# Patient Record
Sex: Female | Born: 1990 | Race: White | Hispanic: No | Marital: Single
Health system: Southern US, Community
[De-identification: ages and names within clinical notes are randomized; demographics above are authoritative.]

---

## 2012-02-29 ENCOUNTER — Emergency Department: Payer: Self-pay | Admitting: *Deleted

## 2012-03-01 LAB — URINALYSIS, COMPLETE
Bacteria: NEGATIVE
Bacteria: NEGATIVE
Bilirubin,UR: NEGATIVE
Bilirubin,UR: NEGATIVE
Glucose,UR: NEGATIVE mg/dL (ref 0–75)
Ketone: NEGATIVE
Ketone: NEGATIVE
Leukocyte Esterase: NEGATIVE
Leukocyte Esterase: NEGATIVE
Nitrite: NEGATIVE
Ph: 5 (ref 4.5–8.0)
Specific Gravity: 1.018 (ref 1.003–1.030)
Specific Gravity: 1.024 (ref 1.003–1.030)

## 2012-03-01 LAB — BASIC METABOLIC PANEL
Calcium, Total: 8.9 mg/dL — ABNORMAL LOW (ref 9.0–10.7)
Chloride: 106 mmol/L (ref 97–107)
Creatinine: 0.72 mg/dL (ref 0.60–1.30)
EGFR (African American): >60
EGFR (Non-African Amer.): >60
Glucose: 91 mg/dL (ref 65–99)
Sodium: 142 mmol/L — ABNORMAL HIGH (ref 132–141)

## 2012-03-01 LAB — CBC
HGB: 13.8 g/dL (ref 12.0–16.0)
MCHC: 35.1 g/dL (ref 32.0–36.0)
MCV: 87 fL (ref 80–100)
Platelet: 301 10*3/uL (ref 150–440)
RBC: 4.54 10*6/uL (ref 3.80–5.20)

## 2014-09-13 ENCOUNTER — Inpatient Hospital Stay: Payer: Self-pay | Admitting: Internal Medicine

## 2014-09-13 LAB — CBC WITH DIFFERENTIAL/PLATELET
Basophil #: 0 10*3/uL (ref 0.0–0.1)
Basophil %: 0.1 %
Eosinophil #: 0 10*3/uL (ref 0.0–0.7)
Eosinophil %: 0.1 %
HCT: 42 % (ref 35.0–47.0)
HGB: 14.2 g/dL (ref 12.0–16.0)
LYMPHS PCT: 2.5 %
Lymphocyte #: 0.4 10*3/uL — ABNORMAL LOW (ref 1.0–3.6)
MCH: 29.9 pg (ref 26.0–34.0)
MCHC: 33.7 g/dL (ref 32.0–36.0)
MCV: 89 fL (ref 80–100)
MONOS PCT: 3.3 %
Monocyte #: 0.5 x10 3/mm (ref 0.2–0.9)
Neutrophil #: 15.6 10*3/uL — ABNORMAL HIGH (ref 1.4–6.5)
Neutrophil %: 94 %
PLATELETS: 250 10*3/uL (ref 150–440)
RBC: 4.74 10*6/uL (ref 3.80–5.20)
RDW: 12.3 % (ref 11.5–14.5)
WBC: 16.6 10*3/uL — ABNORMAL HIGH (ref 3.6–11.0)

## 2014-09-13 LAB — COMPREHENSIVE METABOLIC PANEL
ALBUMIN: 3.7 g/dL (ref 3.4–5.0)
ALT: 23 U/L
AST: 27 U/L (ref 15–37)
Alkaline Phosphatase: 44 U/L — ABNORMAL LOW
Anion Gap: 10 (ref 7–16)
BUN: 12 mg/dL (ref 7–18)
Bilirubin,Total: 0.8 mg/dL (ref 0.2–1.0)
Calcium, Total: 8 mg/dL — ABNORMAL LOW (ref 8.5–10.1)
Chloride: 109 mmol/L — ABNORMAL HIGH (ref 98–107)
Co2: 24 mmol/L (ref 21–32)
Creatinine: 0.84 mg/dL (ref 0.60–1.30)
EGFR (African American): >60
EGFR (Non-African Amer.): >60
GLUCOSE: 140 mg/dL — AB (ref 65–99)
Osmolality: 287 (ref 275–301)
Potassium: 3.8 mmol/L (ref 3.5–5.1)
SODIUM: 143 mmol/L (ref 136–145)
TOTAL PROTEIN: 6.6 g/dL (ref 6.4–8.2)

## 2014-09-13 LAB — URINALYSIS, COMPLETE
Bacteria: NONE SEEN
Bilirubin,UR: NEGATIVE
Blood: NEGATIVE
Glucose,UR: NEGATIVE mg/dL (ref 0–75)
LEUKOCYTE ESTERASE: NEGATIVE
NITRITE: NEGATIVE
Ph: 7 (ref 4.5–8.0)
Protein: NEGATIVE
RBC,UR: NONE SEEN /HPF (ref 0–5)
Specific Gravity: 1.009 (ref 1.003–1.030)
Squamous Epithelial: NONE SEEN
WBC UR: NONE SEEN /HPF (ref 0–5)

## 2014-09-13 LAB — MAGNESIUM
MAGNESIUM: 2.6 mg/dL — AB
Magnesium: 1.2 mg/dL — ABNORMAL LOW

## 2014-09-13 LAB — PREGNANCY, URINE: Pregnancy Test, Urine: NEGATIVE m[IU]/mL

## 2014-09-13 LAB — LIPASE, BLOOD: Lipase: 94 U/L (ref 73–393)

## 2014-09-14 LAB — BASIC METABOLIC PANEL
Anion Gap: 4 — ABNORMAL LOW (ref 7–16)
BUN: 7 mg/dL (ref 7–18)
CALCIUM: 7.4 mg/dL — AB (ref 8.5–10.1)
CREATININE: 0.76 mg/dL (ref 0.60–1.30)
Chloride: 107 mmol/L (ref 98–107)
Co2: 27 mmol/L (ref 21–32)
EGFR (Non-African Amer.): >60
Glucose: 115 mg/dL — ABNORMAL HIGH (ref 65–99)
Osmolality: 275 (ref 275–301)
POTASSIUM: 3.2 mmol/L — AB (ref 3.5–5.1)
Sodium: 138 mmol/L (ref 136–145)

## 2014-09-14 LAB — CBC WITH DIFFERENTIAL/PLATELET
BASOS ABS: 0 10*3/uL (ref 0.0–0.1)
Basophil %: 0 %
Eosinophil #: 0 10*3/uL (ref 0.0–0.7)
Eosinophil %: 0.1 %
HCT: 36 % (ref 35.0–47.0)
HGB: 12.5 g/dL (ref 12.0–16.0)
LYMPHS ABS: 0.7 10*3/uL — AB (ref 1.0–3.6)
Lymphocyte %: 12 %
MCH: 30.8 pg (ref 26.0–34.0)
MCHC: 34.8 g/dL (ref 32.0–36.0)
MCV: 89 fL (ref 80–100)
Monocyte #: 0.6 x10 3/mm (ref 0.2–0.9)
Monocyte %: 10.2 %
NEUTROS ABS: 4.8 10*3/uL (ref 1.4–6.5)
NEUTROS PCT: 77.7 %
Platelet: 182 10*3/uL (ref 150–440)
RBC: 4.07 10*6/uL (ref 3.80–5.20)
RDW: 11.8 % (ref 11.5–14.5)
WBC: 6.1 10*3/uL (ref 3.6–11.0)

## 2014-09-14 LAB — MAGNESIUM: Magnesium: 2.1 mg/dL

## 2015-02-17 NOTE — Discharge Summary (Signed)
PATIENT NAME:  Natalie Beltran, Natalie Beltran MR#:  [id number] DATE OF BIRTH:  [birth year]  DATE OF ADMISSION:  09/13/2014 DATE OF DISCHARGE:  09/14/2014  PRIMARY CARE PHYSICIAN: Nonlocal.   FINAL DIAGNOSES:  1.  Likely viral gastroenteritis with nausea, vomiting, and diarrhea with leukocytosis.  2.  Hypomagnesemia and hypokalemia.   MEDICATIONS ON DISCHARGE: Include Effexor 150 mg extended-release daily.   FOLLOWUP: [facility].   DIET: Regular, stay hydrated, regular consistency.   HOSPITAL COURSE: The patient was admitted to the hospital 09/13/2014, discharged on 09/14/2014, came in with abdominal pain, nausea, vomiting, diarrhea. Laboratory and radiological data during the hospital course included a magnesium of 1.2, lipase 94, glucose 140, BUN 12, creatinine 0.84, sodium 143, potassium 3.8, chloride 109, CO2 of 24, calcium 8.0. Liver function tests: Alkaline phosphatase 44, ALT 23, AST 27. White blood cell count 16.6, hemoglobin and hematocrit 14.2 and 42.0, platelet count of 250,000. Urine pregnancy test negative. Urinalysis negative. CT scan of the abdomen and pelvis showed findings in the distal small bowel and proximal colon with mild bowel wall thickening changes likely represent enteritis of nonspecific etiology. No evidence of obstruction. Magnesium replaced up to 2.1. White count upon discharge 6.1, hemoglobin 12.5, creatinine 0.76, potassium 3.2. Hospital course per problem list:  1.  Likely viral gastroenteritis with nausea, vomiting, diarrhea, and leukocytosis. All symptoms had resolved. A few other people at school had similar symptoms, so this is likely viral. The patient was stable for discharge home without any further symptoms on 09/14/2014.  2.  For the patient's hypomagnesemia and hypokalemia both these were replaced during the hospital course.  3.  For the patient's depression the patient is on Effexor.   PHYSICAL EXAMINATION UPON DISCHARGE:   VITAL SIGNS: Temperature 98.3, pulse 83, respirations 18, blood pressure 103/69 pulse oximetry 95% on room air.  LUNGS: Clear to auscultation. No use of accessory muscles to breathe.  CARDIOVASCULAR SYSTEM: S1, S2 normal. No gallops, rubs, or murmurs heard. Carotid upstroke 2+ bilaterally. No bruits. Dorsalis pedis pulses 2+ bilaterally.  ABDOMEN: Soft, nontender. No organomegaly/splenomegaly. Normoactive bowel sounds. No masses felt.  LYMPHATIC: No lymph nodes in the neck.  MUSCULOSKELETAL: No clubbing, edema, or cyanosis.   TIME SPENT ON DISCHARGE: 35 minutes.    ____________________________ Herschell Dimesichard J. Renae GlossWieting, MD rjw:bm D: 09/14/2014 14:08:41 ET T: 09/15/2014 01:39:55 ET JOB#: [id number]  cc: Herschell Dimesichard J. Renae GlossWieting, MD, <Dictator> Salley ScarletICHARD J Dacian Orrico MD ELECTRONICALLY SIGNED 09/15/2014 16:32

## 2015-02-17 NOTE — H&P (Signed)
PATIENT NAME:  Natalie Beltran, Natalie Beltran MR#:  [id number] DATE OF BIRTH:  19-May-1991  DATE OF ADMISSION:  09/13/2014  PRIMARY CARE PHYSICIAN: Nonlocal.   REFERRING PHYSICIAN: Gladstone Pih, MD    CHIEF COMPLAINT: Abdominal pain, nausea, vomiting and diarrhea since last night.   HISTORY OF PRESENT ILLNESS: A 24 years old Caucasian female with a history of anxiety presented to the ED with above chief complaint. The patient is alert, awake, oriented, in no acute distress. The patient is an [employer].  She started to have nausea, vomiting, diarrhea since 8:00 p.m. yesterday. She noticed some blood last night but no more blood in the stool today. Diarrhea is watery. The patient got a CAT scan, which did not show any obstruction. The patient was treated with Zofran and IV fluid support but the patient still had persistent intractable nausea, vomiting. In addition, the patient has headache, dizziness and generalized weakness.   PAST MEDICAL HISTORY: Anxiety.   SOCIAL HISTORY: No smoking, alcohol drinking or illicit drugs.  SURGICAL HISTORY:  None.    FAMILY HISTORY: Grandmother has diabetes.   REVIEW OF SYSTEMS:   CONSTITUTIONAL: The patient denies any fever or chills but has headache, dizziness and weakness.  EYES: No double vision or blurry vision.  EARS, NOSE, AND THROAT: No postnasal drip or slurred speech or dysphagia.  CARDIOVASCULAR: No chest pain, palpitation, orthopnea, nocturnal dyspnea. No leg edema.  PULMONARY: No cough, sputum, shortness of breath, or hemoptysis.   GASTROINTESTINAL: Positive for abdominal pain, nausea, vomiting, diarrhea. No melena, but bloody stool.  GENITOURINARY: No dysuria, hematuria, or incontinence.  SKIN: No rash or jaundice.  NEUROLOGY: No syncope, loss of consciousness, or seizure.  ENDOCRINOLOGY: No polyuria, polydipsia, heat or cold intolerance.  HEMATOLOGY: No easy bruising or bleeding.   ALLERGIES: None.   HOME MEDICATIONS: Effexor 150 mg  one cap p.o. daily.   PHYSICAL EXAMINATION:  VITAL SIGNS: Temperature 98.4, blood pressure 111/70, pulse 65, O2 saturation 97% on room air.  GENERAL: The patient is alert, awake, oriented, in no acute distress.  HEENT: Pupils round, equal, react to light and accommodation. Moist oral mucosa. Clear oropharynx.  NECK: Supple. No JVD or carotid bruits. No lymphadenopathy. No thyromegaly,  CARDIOVASCULAR: S1 and S2, regular rate and rhythm. No murmurs, gallops.  PULMONARY: Bilateral air entry. No wheezing or rales. No use of accessory muscle to breathe.  ABDOMEN: Diffuse tenderness with mild rigidity but no rebound. Bowel sounds present. No organomegaly.  EXTREMITIES: No edema, clubbing or cyanosis. No calf tenderness. Bilateral pedal pulses present.  SKIN: No rash, jaundice.  NEUROLOGIC: A/O x 3. No focal deficit. Power 5/5. Sensory intact.   LABORATORY DATA: CAT scan of abdomen and pelvis show possible enteritis. Urinalysis negative. Pregnant test negative. WBC 16.6, hemoglobin 14.2, platelets 250, glucose 140, BUN 12, creatinine 0.84. Electrolytes normal. Lipase 94.   IMPRESSIONS:  1.  Acute gastroenteritis.  2.  Anxiety.   3.  Leukocytosis possibly due to reaction.   PLAN OF TREATMENT:  1.  The patient will be placed on observation and we will give IV fluid support, Zofran p.r.n. and pain medication p.r.n. In addition, we will give Cipro IV b.i.d. Follow up BMP and magneseum. 2.  We will check stool culture and occult.  3.  The patient said that other 2 students had similar symptoms last night but the patient denies any ill contact with them.    Discussed the patient's condition and plan of treatment with the patient.   TIME SPENT: About 45  minutes.    ____________________________ Shaune PollackQing Romaine Neville, MD qc:AT D: 09/13/2014 03:38:13 ET T: 09/13/2014 04:54:17 ET JOB#: [id number]  cc: Shaune PollackQing Latora Quarry, MD, <Dictator> Shaune PollackQING Alisea Matte MD ELECTRONICALLY SIGNED 09/13/2014 7:02

## 2015-10-19 IMAGING — CT CT ABD-PELV W/ CM
2 of 4 series · 17 of 46 positions shown, 19 images · IV contrast (agent unspecified)
Comparison: 03/01/2012

CLINICAL DATA: Acute onset nausea and vomiting since 0181 hours.

EXAM:
CT ABDOMEN AND PELVIS WITH CONTRAST
TECHNIQUE: Multidetector CT imaging of the abdomen and pelvis was performed
using the standard protocol following bolus administration of
intravenous contrast.
CONTRAST:  100 mL [id number]

[Series 2: routine abd pel with · axial · 0.64mm/px · z∈[-1018,-614]mm · 14 of 89 slices shown, 16 images]
[im 4/89  soft-tissue]
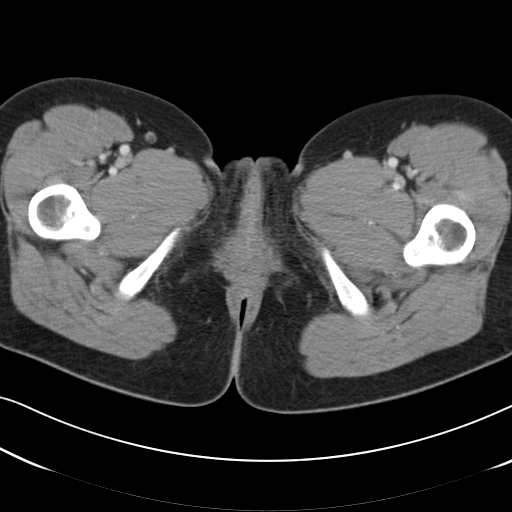
[im 4/89  bone]
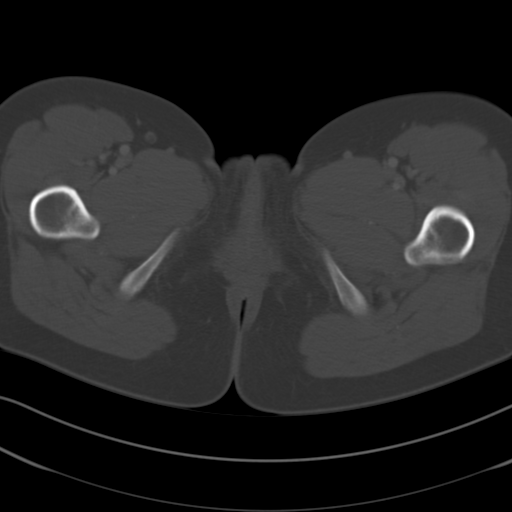
[im 12/89  soft-tissue]
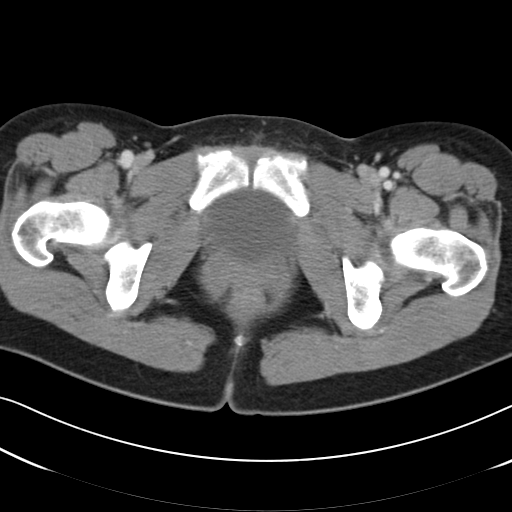
[im 19/89  soft-tissue]
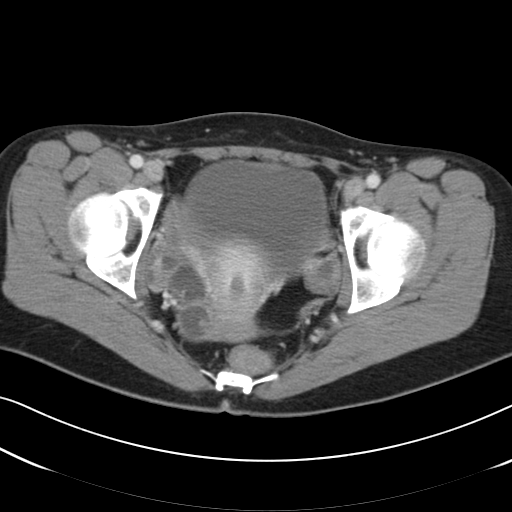
[im 23/89  soft-tissue]
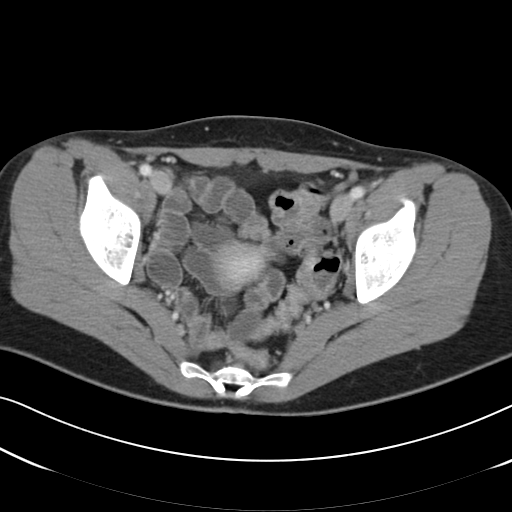
[im 30/89  soft-tissue]
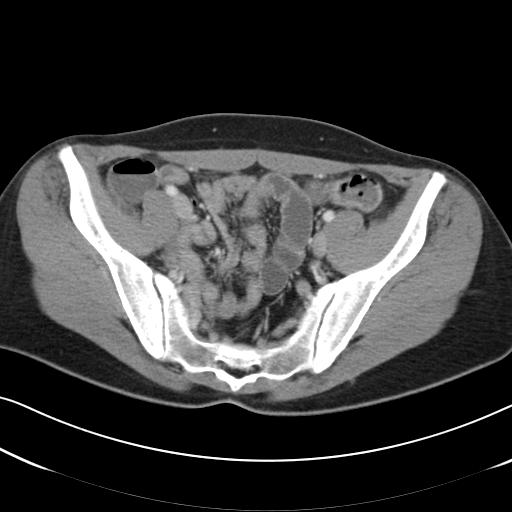
[im 37/89  soft-tissue]
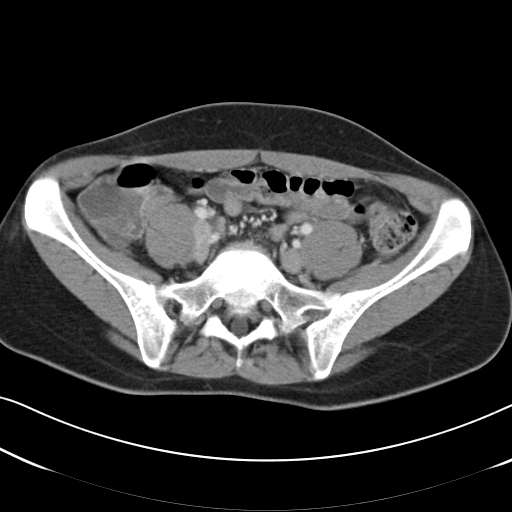
[im 41/89  soft-tissue]
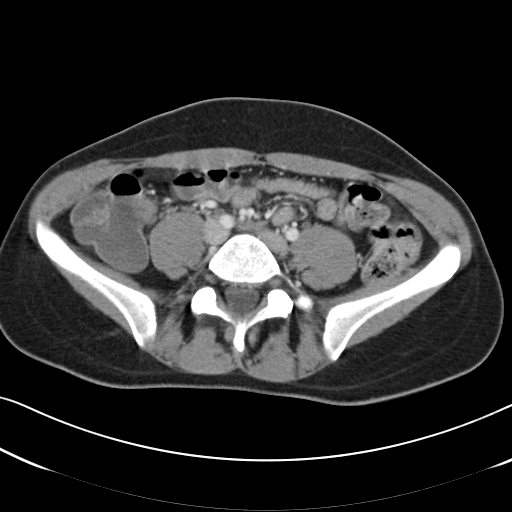
[im 48/89  soft-tissue]
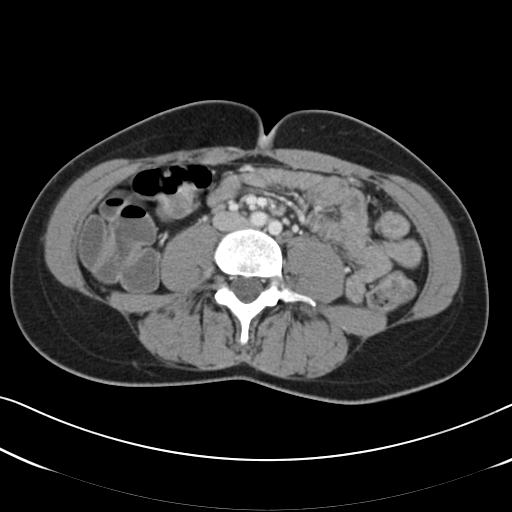
[im 52/89  soft-tissue]
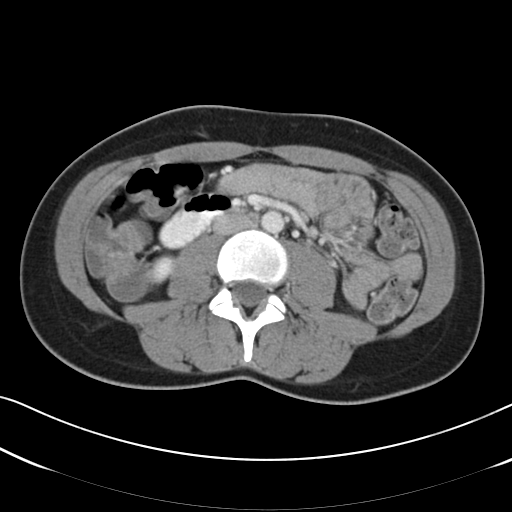
[im 52/89  bone]
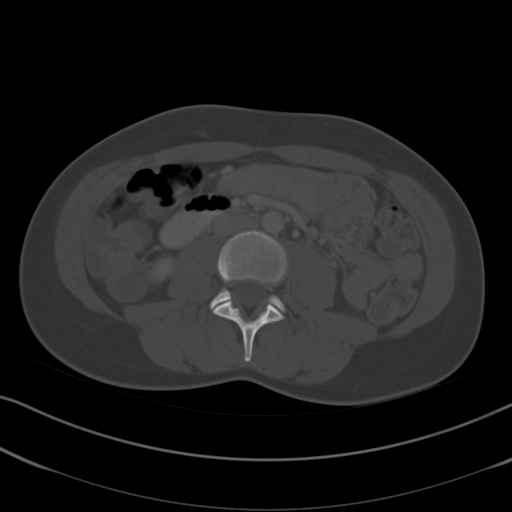
[im 59/89  soft-tissue]
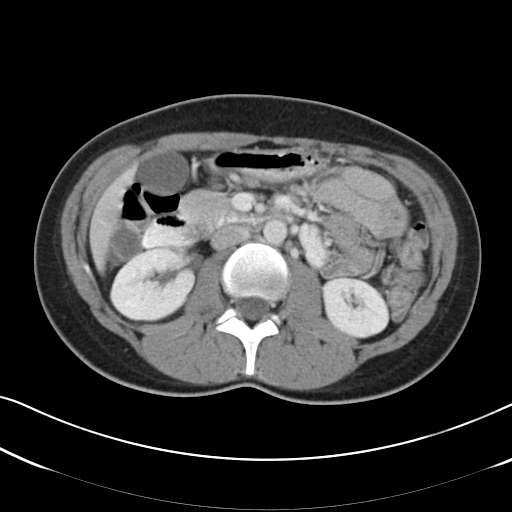
[im 67/89  soft-tissue]
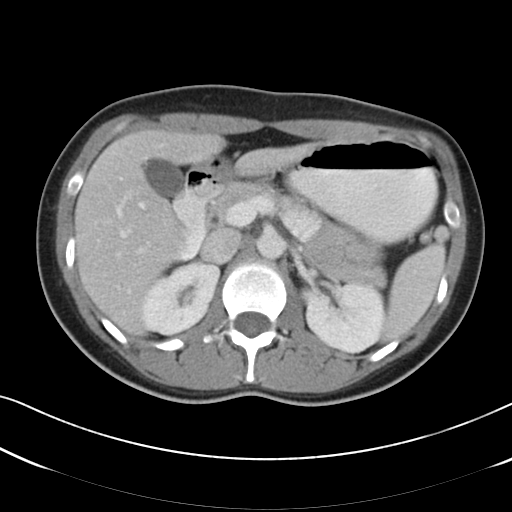
[im 70/89  soft-tissue]
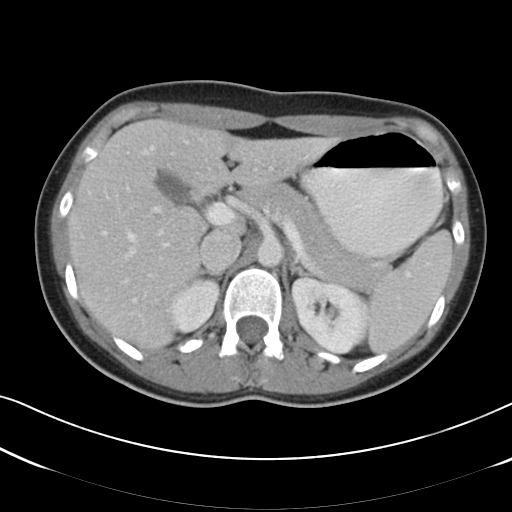
[im 78/89  soft-tissue]
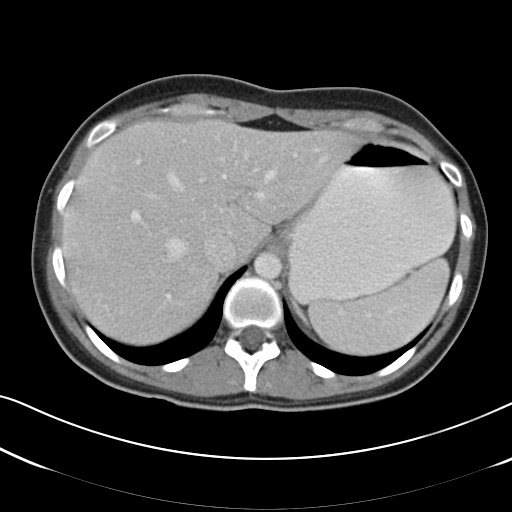
[im 85/89  soft-tissue]
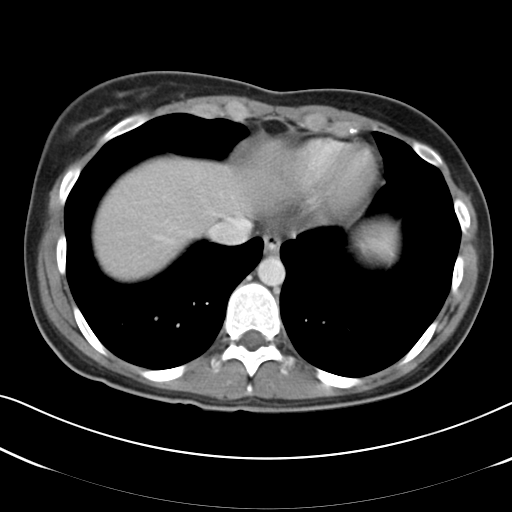

[Series 5: cor routine abd pel with · coronal · 0.89mm/px · 3 of 105 slices shown]
[im 35/105  soft-tissue]
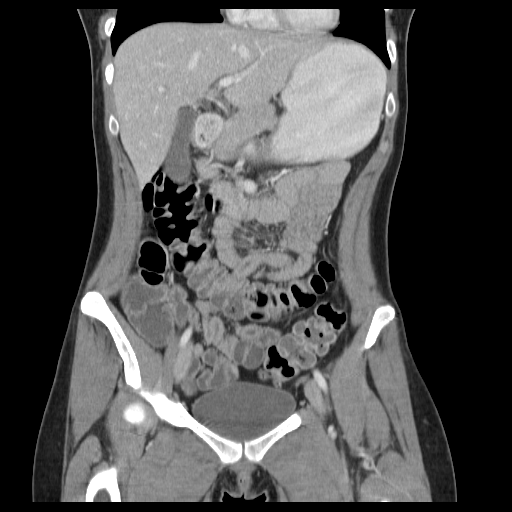
[im 47/105  soft-tissue]
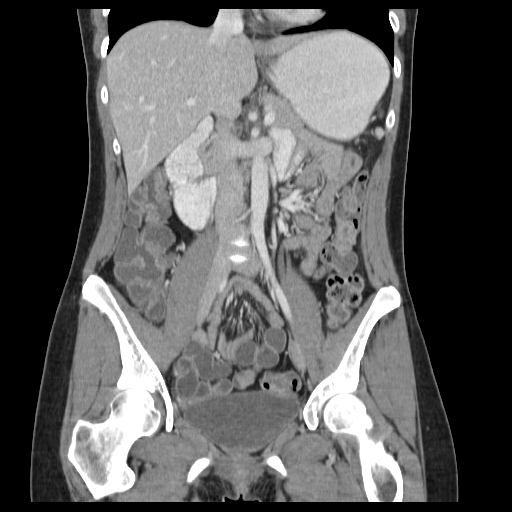
[im 58/105  soft-tissue]
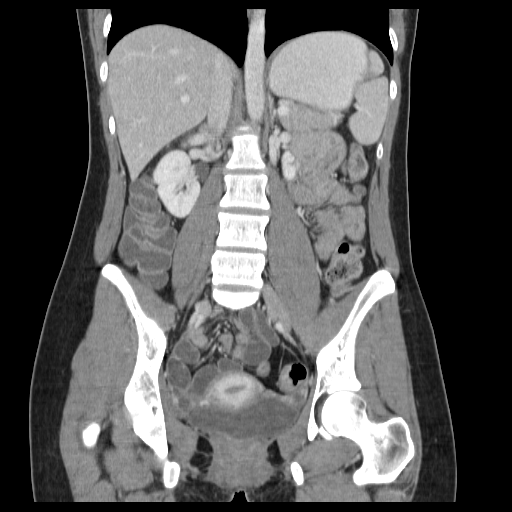

[17 of 46 positions shown; findings below may reference images not displayed]

FINDINGS: Lung bases are clear.

Liver, spleen, gallbladder, pancreas, adrenal glands, kidneys,
abdominal aorta, inferior vena cava, and retroperitoneal lymph nodes
are unremarkable. Stomach and small bowel are not abnormally
distended. Fluid in the distal small bowel loops with mild small
bowel wall thickening. Fluid an air-fluid levels in the colon
consistent with liquid stool. Changes likely represent enteritis. No
bowel distention. No free air or free fluid in the abdomen.

Pelvis: Uterus and ovaries are not enlarged. Bladder wall is not
thickened. No evidence of diverticulitis. Appendix is normal. No
pelvic mass or lymphadenopathy. No free or loculated pelvic fluid
collections. No destructive bone lesions.
IMPRESSION: Fluid in the distal small bowel and proximal colon with mild bowel
wall thickening. Changes likely represent enteritis of nonspecific
etiology. No evidence of obstruction.
